# Patient Record
Sex: Male | Born: 1995 | Race: White | Hispanic: No | Marital: Single | Smoking: Never smoker
Health system: Southern US, Community
[De-identification: ages and names within clinical notes are randomized; demographics above are authoritative.]

## PROBLEM LIST (undated history)

## (undated) DIAGNOSIS — F909 Attention-deficit hyperactivity disorder, unspecified type: Secondary | ICD-10-CM

## (undated) HISTORY — PX: SURGERY SCROTAL / TESTICULAR: SUR1316

---
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
## 2016-10-20 ENCOUNTER — Emergency Department (HOSPITAL_COMMUNITY)
Admission: EM | Admit: 2016-10-20 | Discharge: 2016-10-20 | Disposition: A | Discharge status: Home Health | Payer: No Typology Code available for payment source | Attending: Emergency Medicine | Admitting: Emergency Medicine

## 2016-10-20 ENCOUNTER — Emergency Department (HOSPITAL_COMMUNITY): Payer: No Typology Code available for payment source

## 2016-10-20 ENCOUNTER — Encounter (HOSPITAL_COMMUNITY): Payer: Self-pay

## 2016-10-20 DIAGNOSIS — Y93I9 Activity, other involving external motion: Secondary | ICD-10-CM | POA: Insufficient documentation

## 2016-10-20 DIAGNOSIS — S60222A Contusion of left hand, initial encounter: Secondary | ICD-10-CM | POA: Diagnosis not present

## 2016-10-20 DIAGNOSIS — T148XXA Other injury of unspecified body region, initial encounter: Secondary | ICD-10-CM

## 2016-10-20 DIAGNOSIS — Y929 Unspecified place or not applicable: Secondary | ICD-10-CM | POA: Diagnosis not present

## 2016-10-20 DIAGNOSIS — Y999 Unspecified external cause status: Secondary | ICD-10-CM | POA: Insufficient documentation

## 2016-10-20 DIAGNOSIS — S60922A Unspecified superficial injury of left hand, initial encounter: Secondary | ICD-10-CM | POA: Diagnosis present

## 2016-10-20 MED ORDER — IBUPROFEN 800 MG PO TABS: 800.0000 mg | ORAL_TABLET | Freq: Three times a day (TID) | ORAL | 0 refills | Status: AC

## 2016-10-20 NOTE — Discharge Instructions (Signed)
As discussed, continue to ice and elevate your hand as much as possible for the next several days until your swelling is completely improved.  Take ibuprofen for pain and swelling.  Get rechecked for any worsened or persistent symptoms (a contusion should be resolved within 2 weeks). Your xrays are negative for fracture or dislocation.
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
## 2017-03-11 ENCOUNTER — Encounter (HOSPITAL_COMMUNITY): Payer: Self-pay | Admitting: *Deleted

## 2017-03-11 ENCOUNTER — Other Ambulatory Visit: Payer: Self-pay

## 2017-03-11 ENCOUNTER — Emergency Department (HOSPITAL_COMMUNITY): Payer: No Typology Code available for payment source

## 2017-03-11 ENCOUNTER — Emergency Department (HOSPITAL_COMMUNITY)
Admission: EM | Admit: 2017-03-11 | Discharge: 2017-03-11 | Disposition: A | Discharge status: Home / Self Care | Payer: No Typology Code available for payment source | Attending: Emergency Medicine | Admitting: Emergency Medicine

## 2017-03-11 DIAGNOSIS — Y929 Unspecified place or not applicable: Secondary | ICD-10-CM | POA: Insufficient documentation

## 2017-03-11 DIAGNOSIS — Y939 Activity, unspecified: Secondary | ICD-10-CM | POA: Insufficient documentation

## 2017-03-11 DIAGNOSIS — Z79899 Other long term (current) drug therapy: Secondary | ICD-10-CM | POA: Diagnosis not present

## 2017-03-11 DIAGNOSIS — S060X0A Concussion without loss of consciousness, initial encounter: Secondary | ICD-10-CM | POA: Insufficient documentation

## 2017-03-11 DIAGNOSIS — Y999 Unspecified external cause status: Secondary | ICD-10-CM | POA: Insufficient documentation

## 2017-03-11 DIAGNOSIS — F909 Attention-deficit hyperactivity disorder, unspecified type: Secondary | ICD-10-CM | POA: Diagnosis not present
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
## 2017-03-11 NOTE — Discharge Instructions (Signed)
Your CT scan is negative.  Your symptoms are most likely due to a concussion. Read attached information on concussion.   Concussion symptoms include include headache, nausea, sleep disturbance, dizziness, short term memory loss. Usually these symptom improve slowly over a period of 2-3 weeks, usually sooner. These symptoms wax and wane, and can be worsened by physical or cognitive exertion like reading, watching Tv, studying, using computer, playing video games.   Take tylenol or ibuprofen for associated pain. Ice your scalp. We recommend physical and cognitive rest for the next 48-72 hours. This means avoid any exertional or cognitive activity that makes your symptoms worse including reading, playing video games, watching TV, exercising, jumping, playing sports.  The most important part of concussion management is avoiding a repeat head injury, do not perform any activities that will put you at risk for a second head injury. You must be cleared by a clinician before fully returning to sports. If your symptoms last longer than 2-3 week you need to further evaluation by primary care provider or concussion specialist.   Return to the emergency department if you develop mental status change, lethargy, vomiting, vision changes

## 2017-03-11 NOTE — ED Notes (Signed)
Pt ambulated to restroom & returned to room w/ no complications. 

## 2017-03-11 NOTE — ED Notes (Signed)
Pt alert & oriented x4, stable gait. Patient given discharge instructions, paperwork & prescription(s). Patient  instructed to stop at the registration desk to finish any additional paperwork. Patient verbalized understanding. Pt left department w/ no further questions. 
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
## 2018-11-02 DEATH — deceased
